# Patient Record
Sex: Female | Born: 1960 | Hispanic: Yes | Marital: Married | Smoking: Never smoker
Health system: Southern US, Community
[De-identification: ages and names within clinical notes are randomized; demographics above are authoritative.]

## PROBLEM LIST (undated history)

## (undated) DIAGNOSIS — F419 Anxiety disorder, unspecified: Secondary | ICD-10-CM

## (undated) DIAGNOSIS — K219 Gastro-esophageal reflux disease without esophagitis: Secondary | ICD-10-CM

## (undated) HISTORY — PX: CERVICAL POLYPECTOMY: SHX88

## (undated) HISTORY — DX: Anxiety disorder, unspecified: F41.9

## (undated) HISTORY — DX: Gastro-esophageal reflux disease without esophagitis: K21.9

---

## 2001-03-22 ENCOUNTER — Other Ambulatory Visit: Admission: RE | Admit: 2001-03-22 | Discharge: 2001-03-22 | Payer: Self-pay | Admitting: Urology

## 2003-10-15 ENCOUNTER — Other Ambulatory Visit: Admission: RE | Admit: 2003-10-15 | Discharge: 2003-10-15 | Payer: Self-pay | Admitting: *Deleted

## 2004-10-15 ENCOUNTER — Other Ambulatory Visit: Admission: RE | Admit: 2004-10-15 | Discharge: 2004-10-15 | Payer: Self-pay | Admitting: *Deleted

## 2006-01-27 ENCOUNTER — Other Ambulatory Visit: Admission: RE | Admit: 2006-01-27 | Discharge: 2006-01-27 | Payer: Self-pay | Admitting: Family Medicine

## 2007-04-26 ENCOUNTER — Other Ambulatory Visit: Admission: RE | Admit: 2007-04-26 | Discharge: 2007-04-26 | Payer: Self-pay | Admitting: *Deleted

## 2009-06-04 ENCOUNTER — Other Ambulatory Visit: Admission: RE | Admit: 2009-06-04 | Discharge: 2009-06-04 | Payer: Self-pay | Admitting: *Deleted

## 2010-05-10 HISTORY — PX: COLONOSCOPY: SHX174

## 2011-06-21 ENCOUNTER — Other Ambulatory Visit: Payer: Self-pay | Admitting: Family Medicine

## 2011-06-21 ENCOUNTER — Other Ambulatory Visit (HOSPITAL_COMMUNITY)
Admission: RE | Admit: 2011-06-21 | Discharge: 2011-06-21 | Disposition: A | Discharge status: Home / Self Care | Payer: Self-pay | Source: Ambulatory Visit | Attending: Family Medicine | Admitting: Family Medicine

## 2011-06-21 DIAGNOSIS — Z124 Encounter for screening for malignant neoplasm of cervix: Secondary | ICD-10-CM | POA: Insufficient documentation

## 2015-02-26 ENCOUNTER — Other Ambulatory Visit: Payer: Self-pay | Admitting: Family Medicine

## 2015-02-26 ENCOUNTER — Other Ambulatory Visit (HOSPITAL_COMMUNITY)
Admission: RE | Admit: 2015-02-26 | Discharge: 2015-02-26 | Disposition: A | Discharge status: Home / Self Care | Payer: PRIVATE HEALTH INSURANCE | Source: Ambulatory Visit | Attending: Family Medicine | Admitting: Family Medicine

## 2015-02-26 DIAGNOSIS — Z124 Encounter for screening for malignant neoplasm of cervix: Secondary | ICD-10-CM | POA: Insufficient documentation

## 2015-02-28 LAB — CYTOLOGY - PAP

## 2017-02-14 ENCOUNTER — Other Ambulatory Visit: Payer: Self-pay | Admitting: Family Medicine

## 2017-02-14 DIAGNOSIS — N63 Unspecified lump in unspecified breast: Secondary | ICD-10-CM

## 2017-02-15 ENCOUNTER — Other Ambulatory Visit: Payer: PRIVATE HEALTH INSURANCE

## 2017-02-16 ENCOUNTER — Ambulatory Visit
Admission: RE | Admit: 2017-02-16 | Discharge: 2017-02-16 | Disposition: A | Discharge status: Home / Self Care | Payer: PRIVATE HEALTH INSURANCE | Source: Ambulatory Visit | Attending: Family Medicine | Admitting: Family Medicine

## 2017-02-16 ENCOUNTER — Other Ambulatory Visit: Payer: PRIVATE HEALTH INSURANCE

## 2017-02-16 DIAGNOSIS — N63 Unspecified lump in unspecified breast: Secondary | ICD-10-CM

## 2018-02-21 ENCOUNTER — Other Ambulatory Visit: Payer: Self-pay | Admitting: Family Medicine

## 2018-02-21 ENCOUNTER — Other Ambulatory Visit (HOSPITAL_COMMUNITY)
Admission: RE | Admit: 2018-02-21 | Discharge: 2018-02-21 | Disposition: A | Discharge status: Home / Self Care | Payer: PRIVATE HEALTH INSURANCE | Source: Ambulatory Visit | Attending: Family Medicine | Admitting: Family Medicine

## 2018-02-21 DIAGNOSIS — Z124 Encounter for screening for malignant neoplasm of cervix: Secondary | ICD-10-CM | POA: Diagnosis not present

## 2018-02-22 LAB — CYTOLOGY - PAP
Diagnosis: NEGATIVE
HPV: NOT DETECTED

## 2018-03-07 ENCOUNTER — Other Ambulatory Visit: Payer: Self-pay | Admitting: Obstetrics & Gynecology

## 2018-03-16 ENCOUNTER — Other Ambulatory Visit: Payer: Self-pay | Admitting: Family Medicine

## 2018-03-16 ENCOUNTER — Ambulatory Visit
Admission: RE | Admit: 2018-03-16 | Discharge: 2018-03-16 | Disposition: A | Discharge status: Home / Self Care | Payer: PRIVATE HEALTH INSURANCE | Source: Ambulatory Visit | Attending: Family Medicine | Admitting: Family Medicine

## 2018-03-16 DIAGNOSIS — Z1231 Encounter for screening mammogram for malignant neoplasm of breast: Secondary | ICD-10-CM

## 2019-03-05 ENCOUNTER — Other Ambulatory Visit: Payer: Self-pay | Admitting: Registered"

## 2019-03-05 DIAGNOSIS — Z20822 Contact with and (suspected) exposure to covid-19: Secondary | ICD-10-CM

## 2019-03-07 LAB — NOVEL CORONAVIRUS, NAA: SARS-CoV-2, NAA: NOT DETECTED

## 2019-03-28 ENCOUNTER — Ambulatory Visit
Admission: RE | Admit: 2019-03-28 | Discharge: 2019-03-28 | Disposition: A | Discharge status: Home / Self Care | Payer: PRIVATE HEALTH INSURANCE | Source: Ambulatory Visit | Attending: Family Medicine | Admitting: Family Medicine

## 2019-03-28 ENCOUNTER — Other Ambulatory Visit: Payer: Self-pay

## 2019-03-28 ENCOUNTER — Other Ambulatory Visit: Payer: Self-pay | Admitting: Family Medicine

## 2019-03-28 DIAGNOSIS — Z1231 Encounter for screening mammogram for malignant neoplasm of breast: Secondary | ICD-10-CM

## 2019-07-17 IMAGING — MG DIGITAL SCREENING BILATERAL MAMMOGRAM WITH TOMO AND CAD
8 series · 9 of 24 positions shown · non-contrast
Comparison: Previous exam(s).

CLINICAL DATA: Screening.

EXAM:
DIGITAL SCREENING BILATERAL MAMMOGRAM WITH TOMO AND CAD

[L CC synth-2D]
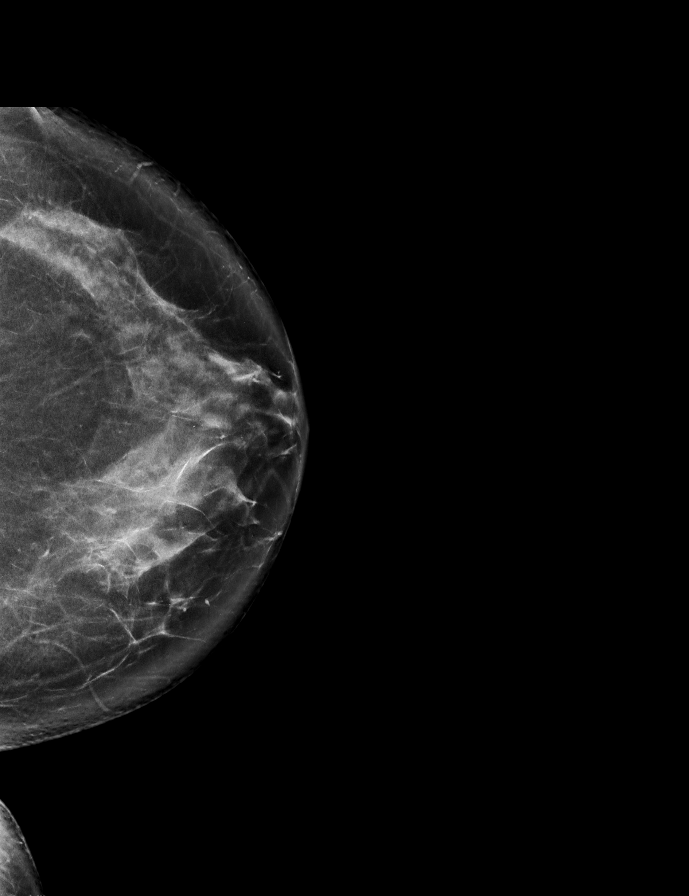

[R MLO synth-2D]
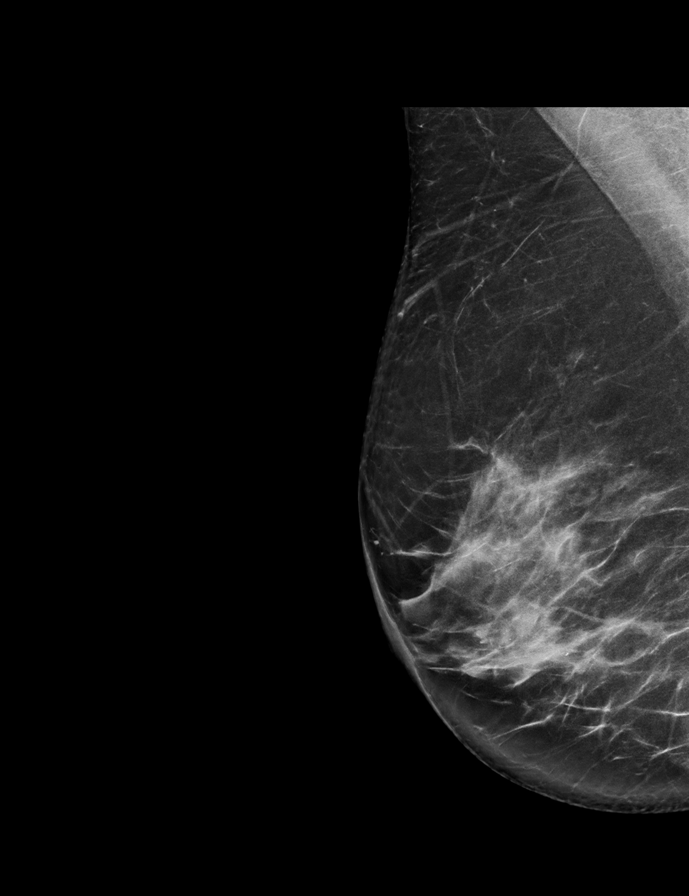

[R CC synth-2D]
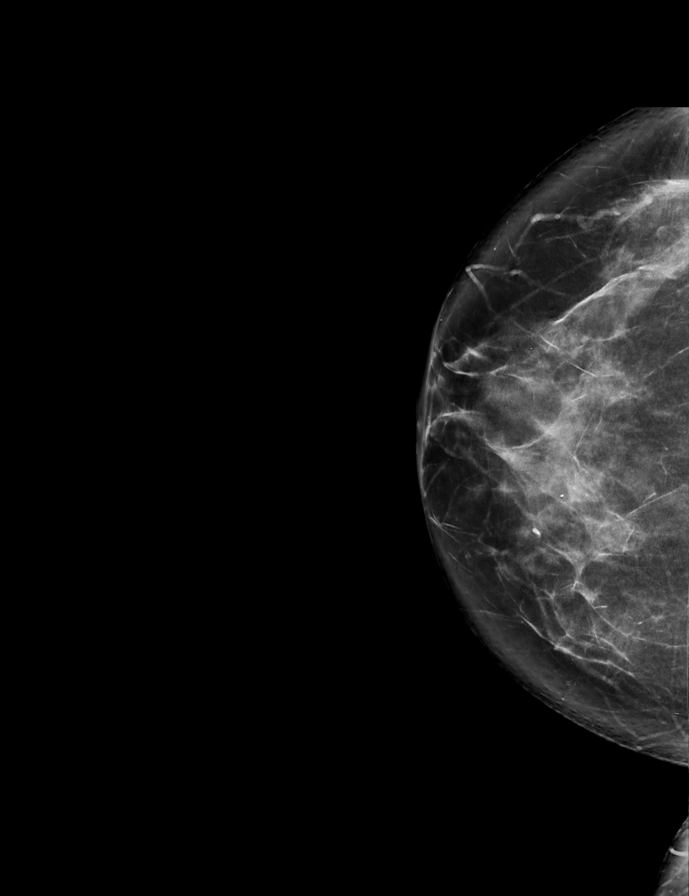

[L MLO synth-2D]
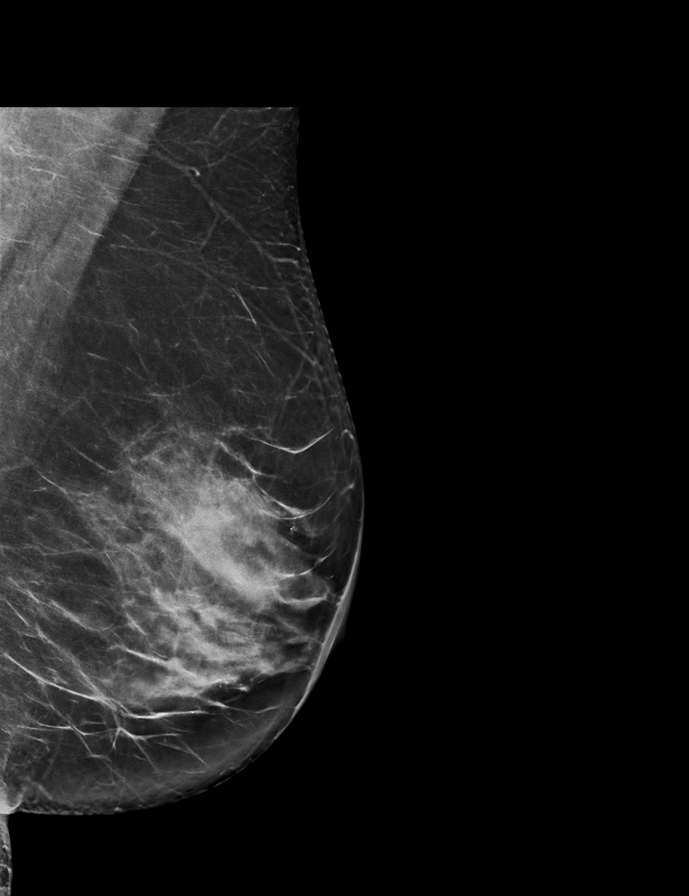

[R MLO tomo · 2 of 81 frames shown]
[frame 27/81]
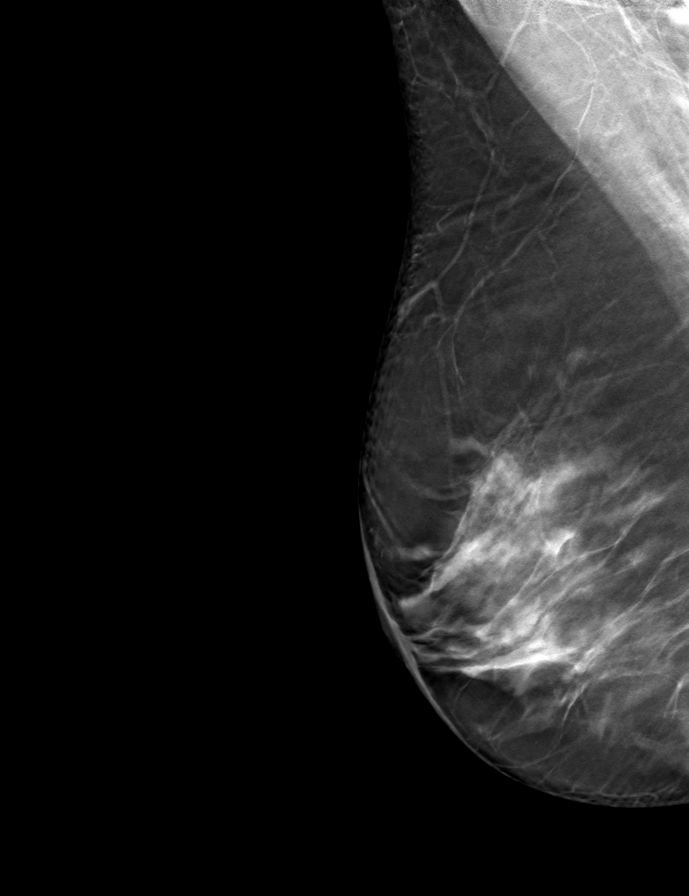
[frame 41/81]
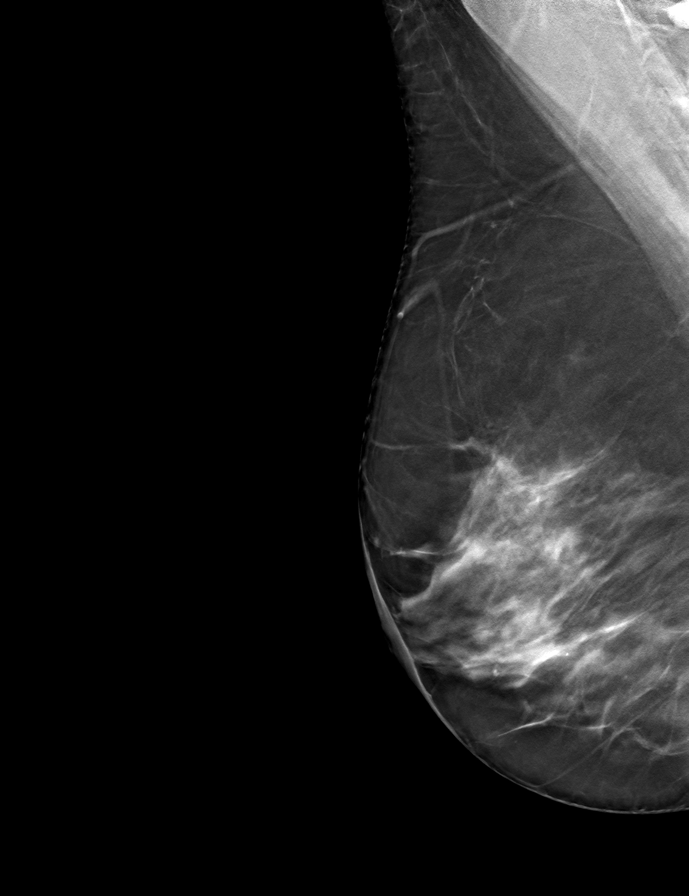

[R CC tomo · tomo slice 39/78.0]
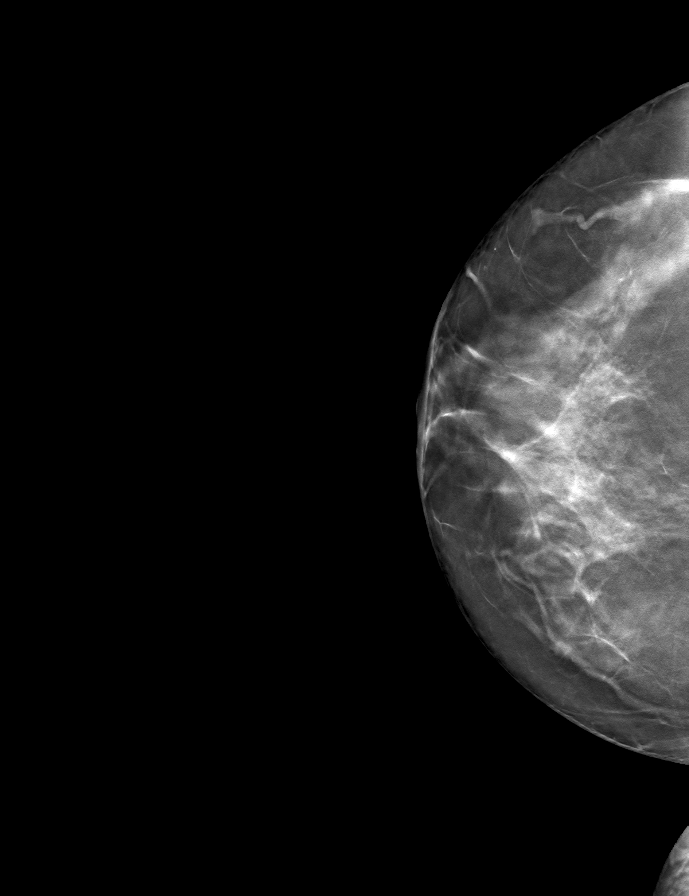

[L CC tomo · tomo slice 41/81.0]
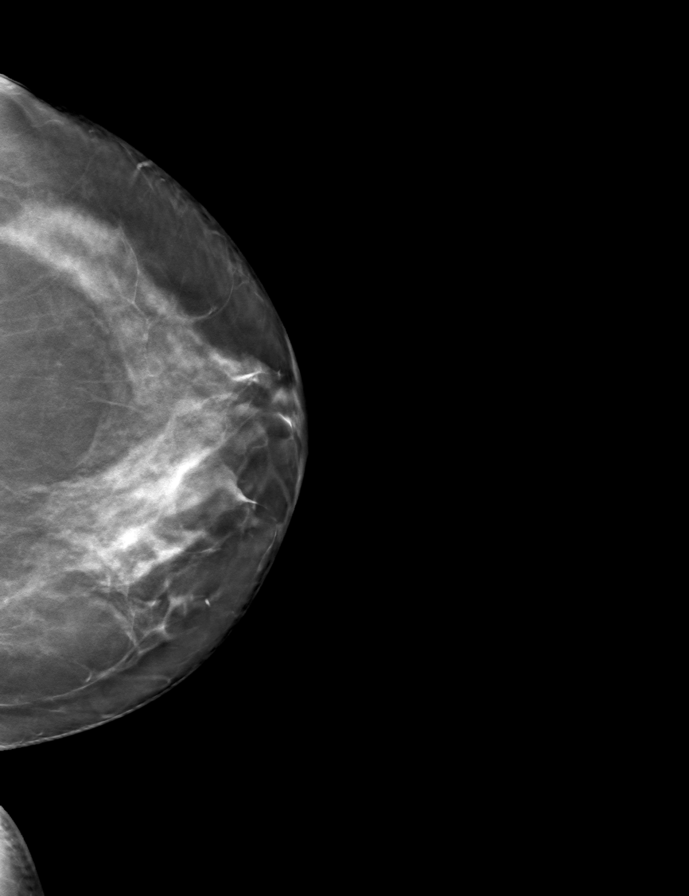

[L MLO tomo · tomo slice 39/78.0]
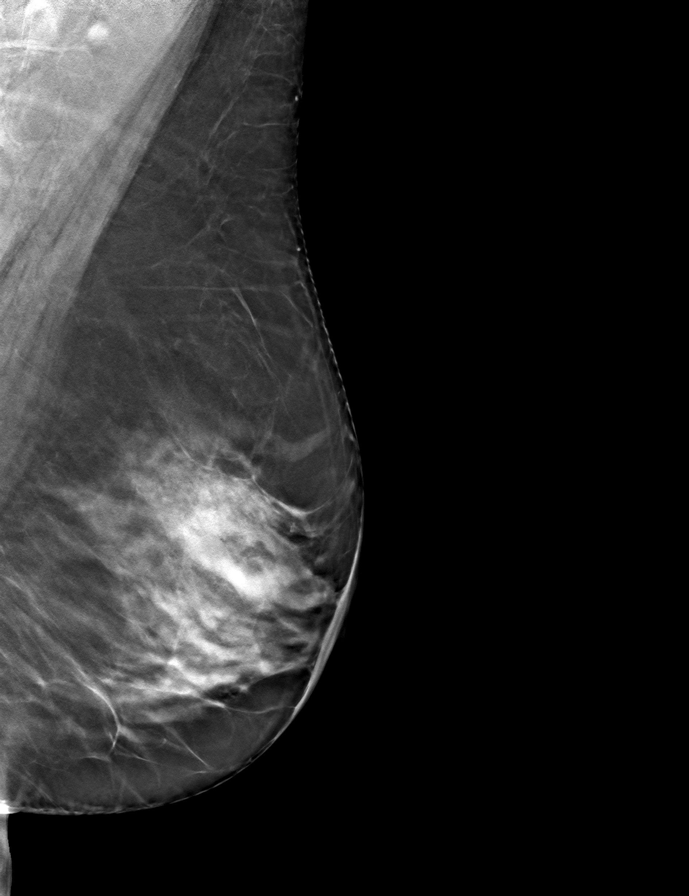

[9 of 24 positions shown; findings below may reference images not displayed]

ACR Breast Density Category d: The breast tissue is extremely dense,
which lowers the sensitivity of mammography.
FINDINGS: There are no findings suspicious for malignancy. Images were
processed with CAD.
IMPRESSION: No mammographic evidence of malignancy. A result letter of this
screening mammogram will be mailed directly to the patient.

RECOMMENDATION:
Screening mammogram in one year. (Code:RA-I-AVB)

BI-RADS CATEGORY  1: Negative.

## 2020-04-01 ENCOUNTER — Other Ambulatory Visit: Payer: Self-pay | Admitting: Family Medicine

## 2020-04-01 ENCOUNTER — Ambulatory Visit
Admission: RE | Admit: 2020-04-01 | Discharge: 2020-04-01 | Disposition: A | Discharge status: Home / Self Care | Payer: Managed Care, Other (non HMO) | Source: Ambulatory Visit | Attending: Family Medicine | Admitting: Family Medicine

## 2020-04-01 ENCOUNTER — Other Ambulatory Visit: Payer: Self-pay

## 2020-04-01 DIAGNOSIS — Z1231 Encounter for screening mammogram for malignant neoplasm of breast: Secondary | ICD-10-CM

## 2021-03-25 ENCOUNTER — Encounter: Payer: Self-pay | Admitting: Gastroenterology

## 2021-04-09 ENCOUNTER — Ambulatory Visit
Admission: RE | Admit: 2021-04-09 | Discharge: 2021-04-09 | Disposition: A | Discharge status: Home / Self Care | Payer: Managed Care, Other (non HMO) | Source: Ambulatory Visit | Attending: Family Medicine | Admitting: Family Medicine

## 2021-04-09 ENCOUNTER — Other Ambulatory Visit: Payer: Self-pay | Admitting: Family Medicine

## 2021-04-09 DIAGNOSIS — Z1231 Encounter for screening mammogram for malignant neoplasm of breast: Secondary | ICD-10-CM

## 2021-05-25 ENCOUNTER — Ambulatory Visit (AMBULATORY_SURGERY_CENTER): Payer: Managed Care, Other (non HMO) | Admitting: *Deleted

## 2021-05-25 ENCOUNTER — Other Ambulatory Visit: Payer: Self-pay

## 2021-05-25 VITALS — Ht 64.0 in | Wt 142.0 lb

## 2021-05-25 DIAGNOSIS — Z1211 Encounter for screening for malignant neoplasm of colon: Secondary | ICD-10-CM

## 2021-05-25 NOTE — Progress Notes (Signed)

## 2021-06-02 ENCOUNTER — Encounter: Payer: Self-pay | Admitting: Gastroenterology

## 2021-06-08 ENCOUNTER — Encounter: Payer: Self-pay | Admitting: Gastroenterology

## 2021-06-08 ENCOUNTER — Ambulatory Visit (AMBULATORY_SURGERY_CENTER): Payer: Managed Care, Other (non HMO) | Admitting: Gastroenterology

## 2021-06-08 ENCOUNTER — Other Ambulatory Visit: Payer: Self-pay

## 2021-06-08 VITALS — BP 113/62 | HR 58 | Temp 97.8°F | Resp 11 | Ht 64.0 in | Wt 142.0 lb

## 2021-06-08 DIAGNOSIS — Z1211 Encounter for screening for malignant neoplasm of colon: Secondary | ICD-10-CM | POA: Diagnosis present

## 2021-06-08 MED ORDER — SODIUM CHLORIDE 0.9 % IV SOLN: 500.0000 mL | INTRAVENOUS | Status: DC

## 2021-06-08 NOTE — Op Note (Signed)
[facility] Patient Name: Deanna Maldonado Procedure Date: 06/08/2021 7:55 AM MRN: [id number] Endoscopist: Jackquline Denmark , MD Age: 61 Referring MD:  Date of Birth: 1960/09/14 Gender: Female Account #: [id number] Procedure:                Colonoscopy Indications:              Screening for colorectal malignant neoplasm Medicines:                Monitored Anesthesia Care Procedure:                Pre-Anesthesia Assessment:                           - Prior to the procedure, a History and Physical                            was performed, and patient medications and                            allergies were reviewed. The patient's tolerance of                            previous anesthesia was also reviewed. The risks                            and benefits of the procedure and the sedation                            options and risks were discussed with the patient.                            All questions were answered, and informed consent                            was obtained. Prior Anticoagulants: The patient has                            taken no previous anticoagulant or antiplatelet                            agents. ASA Grade Assessment: II - A patient with                            mild systemic disease. After reviewing the risks                            and benefits, the patient was deemed in                            satisfactory condition to undergo the procedure.                           After obtaining informed consent, the colonoscope  was passed under direct vision. Throughout the                            procedure, the patient's blood pressure, pulse, and                            oxygen saturations were monitored continuously. The                            PCF-HQ190L Colonoscope was introduced through the                            anus and advanced to the 2 cm into the ileum. The                            colonoscopy was  performed without difficulty. The                            patient tolerated the procedure well. The quality                            of the bowel preparation was good. The terminal                            ileum, ileocecal valve, appendiceal orifice, and                            rectum were photographed. Scope In: 8:00:12 AM Scope Out: 8:12:56 AM Scope Withdrawal Time: 0 hours 8 minutes 29 seconds  Total Procedure Duration: 0 hours 12 minutes 44 seconds  Findings:                 A few rare (3-4) small-mouthed diverticula were                            found in the sigmoid colon.                           Non-bleeding internal hemorrhoids were found during                            retroflexion. The hemorrhoids were small and Grade                            I (internal hemorrhoids that do not prolapse).                           The terminal ileum appeared normal.                           The exam was otherwise without abnormality on                            direct and retroflexion views. Complications:  No immediate complications. Estimated Blood Loss:     Estimated blood loss: none. Impression:               - Minimal sigmoid diverticulosis.                           - The examined portion of the ileum was normal.                           - The examination was otherwise normal on direct                            and retroflexion views.                           - No specimens collected. Recommendation:           - Patient has a contact number available for                            emergencies. The signs and symptoms of potential                            delayed complications were discussed with the                            patient. Return to normal activities tomorrow.                            Written discharge instructions were provided to the                            patient.                           - Resume previous diet.                            - Continue present medications.                           - Repeat colonoscopy in 10 years for screening                            purposes. Earlier, if with any new problems or                            change in family history.                           - The findings and recommendations were discussed                            with the patient's family. Jackquline Denmark, MD 06/08/2021 8:16:10 AM This report has been signed electronically.

## 2021-06-08 NOTE — Progress Notes (Signed)
[facility] History and Physical   Primary Care Physician:  Clayborn Heron, MD   Reason for Procedure:   Colorectal cancer screening   Plan:    colon     HPI: Deanna Maldonado is a 61 y.o. female    Past Medical History:  Diagnosis Date   Anxiety    GERD (gastroesophageal reflux disease)     Past Surgical History:  Procedure Laterality Date   CERVICAL POLYPECTOMY     COLONOSCOPY  2012   Dr.Kingston Shawgo    Prior to Admission medications   Medication Sig Start Date End Date Taking? Authorizing Provider  Ascorbic Acid (VITAMIN C PO) Take by mouth.   Yes [provider]  aspirin 81 MG chewable tablet 1 tab   Yes [provider]  Glucosamine 500 MG CAPS 1 capsule with a meal   Yes [provider]  Krill Oil 500 MG CAPS Take by mouth.   Yes [provider]  Multiple Vitamin (MULTIVITAMIN) tablet Take 1 tablet by mouth daily.   Yes [provider]  omeprazole (PRILOSEC OTC) 20 MG tablet 1 tablet   Yes [provider]  sertraline (ZOLOFT) 50 MG tablet Take 50 mg by mouth daily. 04/20/21  Yes [provider]  Turmeric 400 MG CAPS Take by mouth.   Yes [provider]  VITAMIN D PO Take by mouth.   Yes [provider]  zinc gluconate 50 MG tablet Take 50 mg by mouth daily.   Yes [provider]    Current Outpatient Medications  Medication Sig Dispense Refill   Ascorbic Acid (VITAMIN C PO) Take by mouth.     aspirin 81 MG chewable tablet 1 tab     Glucosamine 500 MG CAPS 1 capsule with a meal     Krill Oil 500 MG CAPS Take by mouth.     Multiple Vitamin (MULTIVITAMIN) tablet Take 1 tablet by mouth daily.     omeprazole (PRILOSEC OTC) 20 MG tablet 1 tablet     sertraline (ZOLOFT) 50 MG tablet Take 50 mg by mouth daily.     Turmeric 400 MG CAPS Take by mouth.     VITAMIN D PO Take by mouth.     zinc gluconate 50 MG tablet Take 50 mg by mouth daily.     Current  Facility-Administered Medications  Medication Dose Route Frequency Provider Last Rate Last Admin   0.9 %  sodium chloride infusion  500 mL Intravenous Continuous Lynann Bologna, MD        Allergies as of 06/08/2021 - Review Complete 06/08/2021  Allergen Reaction Noted   Amoxicillin Swelling and Rash 03/16/2021   Ampicillin Swelling and Rash 05/25/2021   Macrobid [nitrofurantoin] Swelling and Rash 03/16/2021   Sulfa antibiotics Swelling and Rash 03/16/2021    Family History  Problem Relation Age of Onset   Breast cancer Cousin    Colon cancer Neg Hx    Colon polyps Neg Hx     Social History   Socioeconomic History   Marital status: Married    Spouse name: Not on file   Number of children: Not on file   Years of education: Not on file   Highest education level: Not on file  Occupational History   Not on file  Tobacco Use   Smoking status: Never   Smokeless tobacco: Never  Vaping Use   Vaping Use: Never used  Substance and Sexual Activity   Alcohol use: Not Currently  Comment: social   Drug use: Not Currently   Sexual activity: Not on file  Other Topics Concern   Not on file  Social History Narrative   Not on file   Social Determinants of Health   Financial Resource Strain: Not on file  Food Insecurity: Not on file  Transportation Needs: Not on file  Physical Activity: Not on file  Stress: Not on file  Social Connections: Not on file  Intimate Partner Violence: Not on file    Review of Systems: Positive for none All other review of systems negative except as mentioned in the HPI.  Physical Exam: Vital signs in last 24 hours: @VSRANGES @   General:   Alert,  Well-developed, well-nourished, pleasant and cooperative in NAD Lungs:  Clear throughout to auscultation.   Heart:  Regular rate and rhythm; no murmurs, clicks, rubs,  or gallops. Abdomen:  Soft, nontender and nondistended. Normal bowel sounds.   Neuro/Psych:  Alert and cooperative. Normal mood and  affect. A and O x 3    No significant changes were identified.  The patient continues to be an appropriate candidate for the planned procedure and anesthesia.   , MD. [facility] 06/08/2021 7:54 AM@

## 2021-06-08 NOTE — Patient Instructions (Signed)
Thank you for letting us take care of your healthcare needs today. ?Please see handouts given to you on Hemorrhoids and Diverticulosis. ? ? ? ?YOU HAD AN ENDOSCOPIC PROCEDURE TODAY AT THE [facility]:   Refer to the procedure report that was given to you for any specific questions about what was found during the examination.  If the procedure report does not answer your questions, please call your gastroenterologist to clarify.  If you requested that your care partner not be given the details of your procedure findings, then the procedure report has been included in a sealed envelope for you to review at your convenience later. ? ?YOU SHOULD EXPECT: Some feelings of bloating in the abdomen. Passage of more gas than usual.  Walking can help get rid of the air that was put into your GI tract during the procedure and reduce the bloating. If you had a lower endoscopy (such as a colonoscopy or flexible sigmoidoscopy) you may notice spotting of blood in your stool or on the toilet paper. If you underwent a bowel prep for your procedure, you may not have a normal bowel movement for a few days. ? ?Please Note:  You might notice some irritation and congestion in your nose or some drainage.  This is from the oxygen used during your procedure.  There is no need for concern and it should clear up in a day or so. ? ?SYMPTOMS TO REPORT IMMEDIATELY: ? ?Following lower endoscopy (colonoscopy or flexible sigmoidoscopy): ? Excessive amounts of blood in the stool ? Significant tenderness or worsening of abdominal pains ? Swelling of the abdomen that is new, acute ? Fever of 100?F or higher ? ? ?For urgent or emergent issues, a gastroenterologist can be reached at any hour by calling [phone]. ?Do not use MyChart messaging for urgent concerns.  ? ? ?DIET:  We do recommend a small meal at first, but then you may proceed to your regular diet.  Drink plenty of fluids but you should avoid alcoholic beverages for 24  hours. ? ?ACTIVITY:  You should plan to take it easy for the rest of today and you should NOT DRIVE or use heavy machinery until tomorrow (because of the sedation medicines used during the test).   ? ?FOLLOW UP: ?Our staff will call the number listed on your records 48-72 hours following your procedure to check on you and address any questions or concerns that you may have regarding the information given to you following your procedure. If we do not reach you, we will leave a message.  We will attempt to reach you two times.  During this call, we will ask if you have developed any symptoms of COVID 19. If you develop any symptoms (ie: fever, flu-like symptoms, shortness of breath, cough etc.) before then, please call [phone].  If you test positive for Covid 19 in the 2 weeks post procedure, please call and report this information to us.   ? ?If any biopsies were taken you will be contacted by phone or by letter within the next 1-3 weeks.  Please call us at [phone] if you have not heard about the biopsies in 3 weeks.  ? ? ?SIGNATURES/CONFIDENTIALITY: ?You and/or your care partner have signed paperwork which will be entered into your electronic medical record.  These signatures attest to the fact that that the information above on your After Visit Summary has been reviewed and is understood.  Full responsibility of the confidentiality of this discharge information   you and/or your care-partner.  °

## 2021-06-08 NOTE — Progress Notes (Signed)
Pt's states no medical or surgical changes since previsit or office visit. 

## 2021-06-08 NOTE — Progress Notes (Signed)
Report given to PACU, vss 

## 2021-06-10 ENCOUNTER — Telehealth: Payer: Self-pay | Admitting: *Deleted

## 2021-06-10 NOTE — Telephone Encounter (Signed)
°  Follow up Call-  Call back number 06/08/2021  Post procedure Call Back phone  # [phone]  Permission to leave phone message Yes  Some recent data might be hidden     Patient questions:  Do you have a fever, pain , or abdominal swelling? No. Pain Score  0 *  Have you tolerated food without any problems? Yes.    Have you been able to return to your normal activities? Yes.    Do you have any questions about your discharge instructions: Diet   No. Medications  No. Follow up visit  No.  Do you have questions or concerns about your Care? No.  Actions: * If pain score is 4 or above: No action needed, pain <4.

## 2021-07-08 ENCOUNTER — Encounter: Payer: Self-pay | Admitting: Gastroenterology

## 2021-12-23 DIAGNOSIS — M65331 Trigger finger, right middle finger: Secondary | ICD-10-CM | POA: Diagnosis not present

## 2021-12-23 DIAGNOSIS — M654 Radial styloid tenosynovitis [de Quervain]: Secondary | ICD-10-CM | POA: Diagnosis not present

## 2021-12-23 DIAGNOSIS — M79641 Pain in right hand: Secondary | ICD-10-CM | POA: Diagnosis not present

## 2022-01-08 DIAGNOSIS — L255 Unspecified contact dermatitis due to plants, except food: Secondary | ICD-10-CM | POA: Diagnosis not present

## 2022-04-07 DIAGNOSIS — I8390 Asymptomatic varicose veins of unspecified lower extremity: Secondary | ICD-10-CM | POA: Diagnosis not present

## 2022-04-07 DIAGNOSIS — N6459 Other signs and symptoms in breast: Secondary | ICD-10-CM | POA: Diagnosis not present

## 2022-04-07 DIAGNOSIS — F411 Generalized anxiety disorder: Secondary | ICD-10-CM | POA: Diagnosis not present

## 2022-04-07 DIAGNOSIS — R922 Inconclusive mammogram: Secondary | ICD-10-CM | POA: Diagnosis not present

## 2022-04-07 DIAGNOSIS — Z13220 Encounter for screening for lipoid disorders: Secondary | ICD-10-CM | POA: Diagnosis not present

## 2022-04-07 DIAGNOSIS — Z0000 Encounter for general adult medical examination without abnormal findings: Secondary | ICD-10-CM | POA: Diagnosis not present

## 2022-04-07 DIAGNOSIS — N6452 Nipple discharge: Secondary | ICD-10-CM | POA: Diagnosis not present

## 2022-06-14 ENCOUNTER — Other Ambulatory Visit: Payer: Self-pay | Admitting: *Deleted

## 2022-06-14 DIAGNOSIS — I8393 Asymptomatic varicose veins of bilateral lower extremities: Secondary | ICD-10-CM

## 2022-06-23 ENCOUNTER — Encounter: Payer: Self-pay | Admitting: Physician Assistant

## 2022-06-23 ENCOUNTER — Ambulatory Visit: Payer: BC Managed Care – PPO | Admitting: Physician Assistant

## 2022-06-23 ENCOUNTER — Ambulatory Visit (HOSPITAL_COMMUNITY)
Admission: RE | Admit: 2022-06-23 | Discharge: 2022-06-23 | Disposition: A | Discharge status: Home / Self Care | Payer: BC Managed Care – PPO | Source: Ambulatory Visit | Attending: Vascular Surgery | Admitting: Vascular Surgery

## 2022-06-23 VITALS — BP 167/81 | HR 71 | Temp 97.8°F | Ht 63.0 in | Wt 145.0 lb

## 2022-06-23 DIAGNOSIS — I8393 Asymptomatic varicose veins of bilateral lower extremities: Secondary | ICD-10-CM

## 2022-06-23 NOTE — Progress Notes (Signed)
Requested by:  Marda Stalker, [place],  [place] [address]  Reason for consultation: bilateral lower extremity varicose veins with pain    History of Present Illness   Deanna Maldonado is a 62 y.o. ([birth year]) female who presents for evaluation of varicose veins of bilateral legs with pain. She says these have been present for several years. She has occasional tenderness in the areas where these are. She otherwise denies any itching, burning, stinging, bleeding, aching, or throbbing. She also denies any swelling. She has worn compression stockings in the past but does not regularly. 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 She also is very active. Exercises and runs regularly. She does not have any family history of DVT. She does explain that her dad had some large varicose veins on his legs.   Venous symptoms include:  Onset/duration:  a couple years  Occupation:  Manufacturing systems engineer (bilingual) Aggravating factors: none Alleviating factors: none Compression:  yes Helps:  not really Pain medications:  no Previous vein procedures:  no History of DVT:  no  Past Medical History:  Diagnosis Date   Anxiety    GERD (gastroesophageal reflux disease)     Past Surgical History:  Procedure Laterality Date   CERVICAL POLYPECTOMY     COLONOSCOPY  2012   Dr.Gupta    Social History   Socioeconomic History   Marital status: Married    Spouse name: Not on file   Number of children: Not on file   Years of education: Not on file   Highest education level: Not on file  Occupational History   Not on file  Tobacco Use   Smoking status: Never   Smokeless tobacco: Never  Vaping Use   Vaping Use: Never used  Substance and Sexual Activity   Alcohol use: Not Currently    Comment: social   Drug use: Not Currently   Sexual activity: Not on file  Other Topics Concern   Not on file  Social History  Narrative   Not on file   Social Determinants of Health   Financial Resource Strain: Not on file  Food Insecurity: Not on file  Transportation Needs: Not on file  Physical Activity: Not on file  Stress: Not on file  Social Connections: Not on file  Intimate Partner Violence: Not on file    Family History  Problem Relation Age of Onset   Breast cancer Cousin    Colon cancer Neg Hx    Colon polyps Neg Hx     Current Outpatient Medications  Medication Sig Dispense Refill   Ascorbic Acid (VITAMIN C PO) Take by mouth.     aspirin 81 MG chewable tablet 1 tab     Glucosamine 500 MG CAPS 1 capsule with a meal     Krill Oil 500 MG CAPS Take by mouth.     Multiple Vitamin (MULTIVITAMIN) tablet Take 1 tablet by mouth daily.     omeprazole (PRILOSEC OTC) 20 MG tablet 1 tablet     sertraline (ZOLOFT) 50 MG tablet Take 50 mg by mouth daily.     Turmeric 400 MG CAPS Take by mouth.     VITAMIN D PO Take by mouth.     zinc gluconate 50 MG tablet Take 50 mg by mouth daily.     No current facility-administered medications for this visit.    Allergies  Allergen Reactions  Amoxicillin Swelling and Rash   Ampicillin Swelling and Rash   Macrobid [Nitrofurantoin] Swelling and Rash   Sulfa Antibiotics Swelling and Rash    REVIEW OF SYSTEMS (negative unless checked):   Cardiac:  []$  Chest pain or chest pressure? []$  Shortness of breath upon activity? []$  Shortness of breath when lying flat? []$  Irregular heart rhythm?  Vascular:  []$  Pain in calf, thigh, or hip brought on by walking? []$  Pain in feet at night that wakes you up from your sleep? []$  Blood clot in your veins? []$  Leg swelling?  Pulmonary:  []$  Oxygen at home? []$  Productive cough? []$  Wheezing?  Neurologic:  []$  Sudden weakness in arms or legs? []$  Sudden numbness in arms or legs? []$  Sudden onset of difficult speaking or slurred speech? []$  Temporary loss of vision in one eye? []$  Problems with  dizziness?  Gastrointestinal:  []$  Blood in stool? []$  Vomited blood?  Genitourinary:  []$  Burning when urinating? []$  Blood in urine?  Psychiatric:  []$  Major depression  Hematologic:  []$  Bleeding problems? []$  Problems with blood clotting?  Dermatologic:  []$  Rashes or ulcers?  Constitutional:  []$  Fever or chills?  Ear/Nose/Throat:  []$  Change in hearing? []$  Nose bleeds? []$  Sore throat?  Musculoskeletal:  []$  Back pain? []$  Joint pain? []$  Muscle pain?   Physical Examination     Vitals:   06/23/22 1318  BP: (!) 167/81  Pulse: 71  Temp: 97.8 F (36.6 C)  TempSrc: Temporal  SpO2: 98%  Weight: 145 lb (65.8 kg)  Height: 5' 3"$  (1.6 m)   Body mass index is 25.69 kg/m.  General:  WDWN in NAD; vital signs documented above Gait: Normal HENT: WNL, normocephalic Pulmonary: normal non-labored breathing without wheezing Cardiac: regular HR Vascular Exam/Pulses: Palpable popliteal, DP and PT pulses bilaterally Extremities: with varicose veins, with reticular veins, without edema, without stasis pigmentation, without lipodermatosclerosis, without ulcers Musculoskeletal: no muscle wasting or atrophy  Neurologic: A&O X 3;  No focal weakness or paresthesias are detected Psychiatric:  The pt has Normal affect.  Non-invasive Vascular Imaging   BLE Venous Insufficiency Duplex (06/23/22):  RLE:  No DVT and SVT,  No GSV reflux  GSV diameter 0.28-0.40 cm No SSV reflux  CFV deep venous reflux   Medical Decision Making   Deanna Maldonado is a 62 y.o. female who presents with: RLE chronic venous insufficiency with varicose veins of bilateral lower extremities. Her duplex today shows no DVT or SVT. She does have some deep reflux otherwise her deep and superficial systems are competent. Based on these findings she is not a candidate for venous ablation. She however could have some sclerotherapy. I discussed this option with her and explained that it is considered cosmetic  and would be out of pocket. She does have some interest in pursuing this so I provided her with card for Estell Harpin, RN. She can call to set up appointment with her directly. Based on the patient's history and examination, I recommend: elevation, knee high 15-20 mmHg compression stockings, exercise, refraining from prolonged sitting or standing. She can follow up as needed if she has new or worsening symptoms    Karoline Caldwell, PA-C [facility]: [phone]  06/23/2022, 1:33 PM  Clinic MD: [facility]/ [facility]

## 2023-04-19 DIAGNOSIS — Z0000 Encounter for general adult medical examination without abnormal findings: Secondary | ICD-10-CM | POA: Diagnosis not present

## 2023-04-19 DIAGNOSIS — F411 Generalized anxiety disorder: Secondary | ICD-10-CM | POA: Diagnosis not present

## 2023-04-19 DIAGNOSIS — Z13220 Encounter for screening for lipoid disorders: Secondary | ICD-10-CM | POA: Diagnosis not present

## 2023-04-25 DIAGNOSIS — Z1231 Encounter for screening mammogram for malignant neoplasm of breast: Secondary | ICD-10-CM | POA: Diagnosis not present
